# Patient Record
Sex: Male | Born: 1980 | Race: Black or African American | Hispanic: No | Marital: Single | Smoking: Current every day smoker
Health system: Southern US, Community
[De-identification: ages and names within clinical notes are randomized; demographics above are authoritative.]

## PROBLEM LIST (undated history)

## (undated) DIAGNOSIS — N289 Disorder of kidney and ureter, unspecified: Secondary | ICD-10-CM

## (undated) DIAGNOSIS — F329 Major depressive disorder, single episode, unspecified: Secondary | ICD-10-CM

## (undated) DIAGNOSIS — F32A Depression, unspecified: Secondary | ICD-10-CM

---
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
## 2002-05-29 ENCOUNTER — Emergency Department (HOSPITAL_COMMUNITY): Admission: EM | Admit: 2002-05-29 | Discharge: 2002-05-29 | Payer: Self-pay | Admitting: *Deleted

## 2002-08-16 ENCOUNTER — Encounter: Admission: RE | Admit: 2002-08-16 | Discharge: 2002-08-16 | Payer: Self-pay | Admitting: Family Medicine

## 2005-08-05 ENCOUNTER — Emergency Department (HOSPITAL_COMMUNITY): Admission: EM | Admit: 2005-08-05 | Discharge: 2005-08-05 | Payer: Self-pay | Admitting: Family Medicine

## 2005-11-29 ENCOUNTER — Emergency Department (HOSPITAL_COMMUNITY): Admission: EM | Admit: 2005-11-29 | Discharge: 2005-11-29 | Payer: Self-pay | Admitting: Emergency Medicine

## 2007-08-18 ENCOUNTER — Emergency Department (HOSPITAL_COMMUNITY): Admission: EM | Admit: 2007-08-18 | Discharge: 2007-08-18 | Payer: Self-pay | Admitting: Emergency Medicine

## 2009-05-11 ENCOUNTER — Emergency Department (HOSPITAL_COMMUNITY): Admission: EM | Admit: 2009-05-11 | Discharge: 2009-05-12 | Payer: Self-pay | Admitting: Emergency Medicine

## 2011-08-13 LAB — URINALYSIS, ROUTINE W REFLEX MICROSCOPIC
Hgb urine dipstick: NEGATIVE
Nitrite: NEGATIVE
Specific Gravity, Urine: 1.026
Urobilinogen, UA: 0.2

## 2011-08-13 LAB — COMPREHENSIVE METABOLIC PANEL
Albumin: 4.2
Alkaline Phosphatase: 57
Chloride: 106
Creatinine, Ser: 1.18
Glucose, Bld: 119 — ABNORMAL HIGH
Sodium: 140
Total Bilirubin: 1.4 — ABNORMAL HIGH

## 2011-08-13 LAB — CBC
Hemoglobin: 16.1
MCHC: 33.8
RDW: 13.4

## 2011-08-13 LAB — LIPASE, BLOOD: Lipase: 23

## 2011-08-13 LAB — DIFFERENTIAL
Eosinophils Absolute: 0.2
Lymphocytes Relative: 3 — ABNORMAL LOW
Monocytes Absolute: 0.4
Neutro Abs: 14.3 — ABNORMAL HIGH

## 2012-02-23 ENCOUNTER — Emergency Department (HOSPITAL_COMMUNITY): Payer: No Typology Code available for payment source

## 2012-02-23 ENCOUNTER — Emergency Department (INDEPENDENT_AMBULATORY_CARE_PROVIDER_SITE_OTHER)
Admission: EM | Admit: 2012-02-23 | Discharge: 2012-02-23 | Disposition: A | Discharge status: Nursing Home (Includes ICF) | Payer: Self-pay | Source: Home / Self Care | Attending: Emergency Medicine | Admitting: Emergency Medicine

## 2012-02-23 ENCOUNTER — Emergency Department (HOSPITAL_COMMUNITY)
Admission: EM | Admit: 2012-02-23 | Discharge: 2012-02-23 | Disposition: A | Discharge status: Home / Self Care | Payer: No Typology Code available for payment source | Attending: Emergency Medicine | Admitting: Emergency Medicine

## 2012-02-23 ENCOUNTER — Encounter (HOSPITAL_COMMUNITY): Payer: Self-pay | Admitting: *Deleted

## 2012-02-23 DIAGNOSIS — S069X9A Unspecified intracranial injury with loss of consciousness of unspecified duration, initial encounter: Secondary | ICD-10-CM

## 2012-02-23 DIAGNOSIS — S0990XA Unspecified injury of head, initial encounter: Secondary | ICD-10-CM | POA: Insufficient documentation

## 2012-02-23 DIAGNOSIS — T07XXXA Unspecified multiple injuries, initial encounter: Secondary | ICD-10-CM

## 2012-02-23 DIAGNOSIS — S060X9A Concussion with loss of consciousness of unspecified duration, initial encounter: Secondary | ICD-10-CM

## 2012-02-23 HISTORY — DX: Disorder of kidney and ureter, unspecified: N28.9

## 2012-02-23 MED ORDER — LIDOCAINE HCL 4 % EX SOLN
CUTANEOUS | Status: AC
  Administered 2012-02-23: 16:00:00
  Filled 2012-02-23: qty 50

## 2012-02-23 MED ORDER — TETANUS-DIPHTH-ACELL PERTUSSIS 5-2.5-18.5 LF-MCG/0.5 IM SUSP
0.5000 mL | Freq: Once | INTRAMUSCULAR | Status: AC
  Administered 2012-02-23: 0.5 mL via INTRAMUSCULAR

## 2012-02-23 MED ORDER — TETANUS-DIPHTH-ACELL PERTUSSIS 5-2.5-18.5 LF-MCG/0.5 IM SUSP
INTRAMUSCULAR | Status: AC
  Filled 2012-02-23: qty 0.5

## 2012-02-23 MED ORDER — LIDOCAINE HCL 4 % EX SOLN
CUTANEOUS | Status: AC
  Administered 2012-02-23: 15:00:00
  Filled 2012-02-23: qty 50
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
## 2012-02-23 NOTE — ED Notes (Signed)
Dr. Phillips at bedside.
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
## 2012-02-23 NOTE — ED Notes (Signed)
uknown  As  To  His  Last  Tet shot
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
## 2012-02-23 NOTE — ED Notes (Signed)
Dressings applied to abrasions.

## 2012-02-28 NOTE — ED Provider Notes (Signed)
I saw and evaluated the patient, reviewed the resident's note and I agree with the findings and plan.  Please see completed note for this encounter.  Raeford Razor, MD 02/28/12 [phone]
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
## 2013-07-17 ENCOUNTER — Emergency Department (HOSPITAL_COMMUNITY)
Admission: EM | Admit: 2013-07-17 | Discharge: 2013-07-17 | Disposition: A | Discharge status: Home / Self Care | Payer: BC Managed Care – PPO | Attending: Emergency Medicine | Admitting: Emergency Medicine

## 2013-07-17 ENCOUNTER — Encounter (HOSPITAL_COMMUNITY): Payer: Self-pay | Admitting: *Deleted

## 2013-07-17 DIAGNOSIS — Z79899 Other long term (current) drug therapy: Secondary | ICD-10-CM | POA: Insufficient documentation

## 2013-07-17 DIAGNOSIS — N289 Disorder of kidney and ureter, unspecified: Secondary | ICD-10-CM | POA: Insufficient documentation

## 2013-07-17 DIAGNOSIS — Y990 Civilian activity done for income or pay: Secondary | ICD-10-CM | POA: Insufficient documentation

## 2013-07-17 DIAGNOSIS — F17200 Nicotine dependence, unspecified, uncomplicated: Secondary | ICD-10-CM | POA: Insufficient documentation

## 2013-07-17 DIAGNOSIS — S46909A Unspecified injury of unspecified muscle, fascia and tendon at shoulder and upper arm level, unspecified arm, initial encounter: Secondary | ICD-10-CM | POA: Insufficient documentation

## 2013-07-17 DIAGNOSIS — Y9229 Other specified public building as the place of occurrence of the external cause: Secondary | ICD-10-CM | POA: Insufficient documentation

## 2013-07-17 DIAGNOSIS — S4980XA Other specified injuries of shoulder and upper arm, unspecified arm, initial encounter: Secondary | ICD-10-CM | POA: Insufficient documentation

## 2013-07-17 DIAGNOSIS — W2209XA Striking against other stationary object, initial encounter: Secondary | ICD-10-CM | POA: Insufficient documentation

## 2013-07-17 DIAGNOSIS — M25511 Pain in right shoulder: Secondary | ICD-10-CM

## 2013-07-17 MED ORDER — HYDROCODONE-ACETAMINOPHEN 5-325 MG PO TABS
1.0000 | ORAL_TABLET | Freq: Once | ORAL | Status: AC
  Administered 2013-07-17: 1 via ORAL
  Filled 2013-07-17: qty 1

## 2013-07-17 MED ORDER — IBUPROFEN 800 MG PO TABS
800.0000 mg | ORAL_TABLET | Freq: Once | ORAL | Status: AC
  Administered 2013-07-17: 800 mg via ORAL
  Filled 2013-07-17: qty 1

## 2013-07-17 MED ORDER — IBUPROFEN 800 MG PO TABS: 800.0000 mg | ORAL_TABLET | Freq: Three times a day (TID) | ORAL | Status: AC

## 2013-07-17 MED ORDER — CYCLOBENZAPRINE HCL 10 MG PO TABS: 10.0000 mg | ORAL_TABLET | Freq: Three times a day (TID) | ORAL | Status: AC | PRN

## 2013-07-17 MED ORDER — HYDROCODONE-ACETAMINOPHEN 5-325 MG PO TABS: 1.0000 | ORAL_TABLET | ORAL | Status: AC | PRN

## 2013-07-17 NOTE — ED Notes (Signed)
Pt reports hurting right shoulder last night while moving boxes. Pain 9/10. Limited range of motion. Denies numbness and tingling. Pain radiates from shoulder down to hand and from shoulder up to neck.

## 2013-07-17 NOTE — ED Provider Notes (Signed)
Medical screening examination/treatment/procedure(s) were performed by non-physician practitioner and as supervising physician I was immediately available for consultation/collaboration.  Leasha Goldberger F Adelaido Nicklaus, MD 07/17/13 1812 

## 2013-07-17 NOTE — ED Provider Notes (Signed)
CSN: [id number]     Arrival date & time 07/17/13  1028 History   First MD Initiated Contact with Patient 07/17/13 1119     Chief Complaint  Patient presents with  . Shoulder Pain   (Consider location/radiation/quality/duration/timing/severity/associated sxs/prior Treatment) Patient is a 32 y.o. male presenting with shoulder pain. The history is provided by the patient. No language interpreter was used.  Shoulder Pain This is a new problem. The current episode started yesterday. Pertinent negatives include no chest pain, chills, fever, neck pain, numbness or weakness. Associated symptoms comments: Right shoulder pain after hitting shoulder against equipment last night while working. No neck or chest pain. No previous shoulder problems. .    Past Medical History  Diagnosis Date  . Kidney disease    History reviewed. No pertinent past surgical history. History reviewed. No pertinent family history. History  Substance Use Topics  . Smoking status: Current Every Day Smoker  . Smokeless tobacco: Not on file  . Alcohol Use: No    Review of Systems  Constitutional: Negative for fever and chills.  HENT: Negative.  Negative for neck pain.   Cardiovascular: Negative.  Negative for chest pain.  Musculoskeletal:       See HPI  Skin: Negative.  Negative for color change and wound.  Neurological: Negative.  Negative for weakness and numbness.    Allergies  Review of patient's allergies indicates no known allergies.  Home Medications   Current Outpatient Rx  Name  Route  Sig  Dispense  Refill  . cyclobenzaprine (FLEXERIL) 10 MG tablet   Oral   Take 10 mg by mouth at bedtime as needed for muscle spasms.         . divalproex (DEPAKOTE ER) 500 MG 24 hr tablet   Oral   Take 500 mg by mouth every morning.         . Lurasidone HCl (LATUDA) 60 MG TABS   Oral   Take 1 tablet by mouth every evening.          BP 140/85  Pulse 75  Temp(Src) 99 F (37.2 C) (Oral)  Resp 18  SpO2  99% Physical Exam  Constitutional: He is oriented to person, place, and time. He appears well-developed and well-nourished.  Neck: Normal range of motion.  Pulmonary/Chest: Effort normal. He exhibits no tenderness.  Musculoskeletal: Normal range of motion.  Right shoulder without swelling, discoloration or bony deformity. Point tenderness over anterior deltoid area. No mass. No midline or paracervical neck tenderness. Range of motion of right shoulder limited on abduction and forward extension due to discomfort. 5/5 grip strength on right.   Neurological: He is alert and oriented to person, place, and time.  Skin: Skin is warm and dry.  Psychiatric: He has a normal mood and affect.    ED Course  Procedures (including critical care time) Labs Review Labs Reviewed - No data to display Imaging Review No results found.  MDM  No diagnosis found. 1. Right shoulder pain  No functional deficits on exam. Shoulder benign in appearance. Suspect mild soft tissue injury, ?bursitis.    Arnoldo Hooker, PA-C 07/17/13 1147

## 2015-09-05 ENCOUNTER — Emergency Department
Admission: EM | Admit: 2015-09-05 | Discharge: 2015-09-05 | Discharge status: Against Medical Advice | Payer: BLUE CROSS/BLUE SHIELD | Attending: Emergency Medicine | Admitting: Emergency Medicine

## 2015-09-05 DIAGNOSIS — R112 Nausea with vomiting, unspecified: Secondary | ICD-10-CM | POA: Diagnosis not present

## 2015-09-05 DIAGNOSIS — R197 Diarrhea, unspecified: Secondary | ICD-10-CM | POA: Diagnosis not present

## 2015-09-05 DIAGNOSIS — Z720 Tobacco use: Secondary | ICD-10-CM | POA: Insufficient documentation

## 2015-09-05 HISTORY — DX: Depression, unspecified: F32.A

## 2015-09-05 HISTORY — DX: Major depressive disorder, single episode, unspecified: F32.9

## 2015-09-05 LAB — COMPREHENSIVE METABOLIC PANEL
ALBUMIN: 4.3 g/dL (ref 3.5–5.0)
ALK PHOS: 66 U/L (ref 38–126)
ALT: 24 U/L (ref 17–63)
AST: 23 U/L (ref 15–41)
Anion gap: 4 — ABNORMAL LOW (ref 5–15)
BUN: 12 mg/dL (ref 6–20)
CALCIUM: 8.9 mg/dL (ref 8.9–10.3)
CO2: 25 mmol/L (ref 22–32)
CREATININE: 1.22 mg/dL (ref 0.61–1.24)
Chloride: 107 mmol/L (ref 101–111)
GFR calc Af Amer: >60 mL/min (ref >60)
GFR calc non Af Amer: >60 mL/min (ref >60)
GLUCOSE: 118 mg/dL — AB (ref 65–99)
Potassium: 4 mmol/L (ref 3.5–5.1)
SODIUM: 136 mmol/L (ref 135–145)
Total Bilirubin: 0.5 mg/dL (ref 0.3–1.2)
Total Protein: 7.7 g/dL (ref 6.5–8.1)

## 2015-09-05 LAB — CBC
HCT: 47.2 % (ref 40.0–52.0)
HEMOGLOBIN: 15.7 g/dL (ref 13.0–18.0)
MCH: 30.1 pg (ref 26.0–34.0)
MCHC: 33.2 g/dL (ref 32.0–36.0)
MCV: 90.8 fL (ref 80.0–100.0)
Platelets: 197 10*3/uL (ref 150–440)
RBC: 5.2 MIL/uL (ref 4.40–5.90)
RDW: 13.7 % (ref 11.5–14.5)
WBC: 10.7 10*3/uL — AB (ref 3.8–10.6)

## 2015-09-05 LAB — LIPASE, BLOOD: Lipase: 25 U/L (ref 11–51)

## 2015-09-05 NOTE — ED Notes (Signed)
caled in lobby. No answser.

## 2015-09-05 NOTE — ED Notes (Signed)
Patient with nausea, vomiting, and diarrhea for 2 days.  Patient reports vomiting 3 times yesterday and once today.  Patient reports having diarrhea every hour yesterday and today.

## 2015-09-06 ENCOUNTER — Emergency Department
Admission: EM | Admit: 2015-09-06 | Discharge: 2015-09-06 | Disposition: A | Discharge status: Home / Self Care | Payer: BLUE CROSS/BLUE SHIELD | Attending: Emergency Medicine | Admitting: Emergency Medicine

## 2015-09-06 DIAGNOSIS — K529 Noninfective gastroenteritis and colitis, unspecified: Secondary | ICD-10-CM | POA: Insufficient documentation

## 2015-09-06 DIAGNOSIS — Z720 Tobacco use: Secondary | ICD-10-CM | POA: Insufficient documentation

## 2015-09-06 DIAGNOSIS — R112 Nausea with vomiting, unspecified: Secondary | ICD-10-CM | POA: Diagnosis present

## 2015-09-06 DIAGNOSIS — Z79899 Other long term (current) drug therapy: Secondary | ICD-10-CM | POA: Insufficient documentation

## 2015-09-06 LAB — URINALYSIS COMPLETE WITH MICROSCOPIC (ARMC ONLY)
BACTERIA UA: NONE SEEN
Bilirubin Urine: NEGATIVE
Glucose, UA: NEGATIVE mg/dL
Hgb urine dipstick: NEGATIVE
Ketones, ur: NEGATIVE mg/dL
Leukocytes, UA: NEGATIVE
Nitrite: NEGATIVE
PH: 5 (ref 5.0–8.0)
PROTEIN: NEGATIVE mg/dL
Specific Gravity, Urine: 1.024 (ref 1.005–1.030)

## 2015-09-06 MED ORDER — DICYCLOMINE HCL 20 MG PO TABS: 20.0000 mg | ORAL_TABLET | Freq: Three times a day (TID) | ORAL | Status: AC | PRN

## 2015-09-06 MED ORDER — OMEPRAZOLE 40 MG PO CPDR: 40.0000 mg | DELAYED_RELEASE_CAPSULE | Freq: Every day | ORAL | Status: AC

## 2015-09-06 MED ORDER — GI COCKTAIL ~~LOC~~
30.0000 mL | Freq: Once | ORAL | Status: AC
  Administered 2015-09-06: 30 mL via ORAL
  Filled 2015-09-06: qty 30

## 2015-09-06 MED ORDER — DICYCLOMINE HCL 10 MG/ML IM SOLN: 20.0000 mg | Freq: Once | INTRAMUSCULAR | Status: DC

## 2015-09-06 MED ORDER — ONDANSETRON HCL 4 MG PO TABS: 4.0000 mg | ORAL_TABLET | Freq: Every day | ORAL | Status: AC | PRN

## 2015-09-06 MED ORDER — ONDANSETRON 4 MG PO TBDP
4.0000 mg | ORAL_TABLET | Freq: Once | ORAL | Status: AC
  Administered 2015-09-06: 4 mg via ORAL
  Filled 2015-09-06: qty 1

## 2015-09-06 NOTE — ED Provider Notes (Signed)
[facility] Emergency Department Provider Note   ____________________________________________  Time seen: 0730  I have reviewed the triage vital signs and the nursing notes.   HISTORY  Chief Complaint Emesis   History limited by: Not Limited   HPI Nicolas Williams is a 34 y.o. male who presents to the emergency department today because of concerns for abdominal pain, nausea vomiting and diarrhea. The patient states the symptoms started 3 days ago. He states that they have been worse at night. It will improve somewhat during the day. The patient states he has had multiple episodes of vomiting. He states the pain is burning. It is located periumbilically. His also had multiple episodes of diarrhea. States that he has not been around anybody hewing doses been sick. No uncooked or raw meats.  No travel. No fevers.    Past Medical History  Diagnosis Date  . Kidney disease   . Depression     There are no active problems to display for this patient.   No past surgical history on file.  Current Outpatient Rx  Name  Route  Sig  Dispense  Refill  . cyclobenzaprine (FLEXERIL) 10 MG tablet   Oral   Take 1 tablet (10 mg total) by mouth 3 (three) times daily as needed for muscle spasms.   21 tablet   0   . divalproex (DEPAKOTE ER) 500 MG 24 hr tablet   Oral   Take 500 mg by mouth every morning.         Marland Kitchen HYDROcodone-acetaminophen (NORCO/VICODIN) 5-325 MG per tablet   Oral   Take 1-2 tablets by mouth every 4 (four) hours as needed for pain.   12 tablet   0   . ibuprofen (ADVIL,MOTRIN) 800 MG tablet   Oral   Take 1 tablet (800 mg total) by mouth 3 (three) times daily.   21 tablet   0   . Lurasidone HCl (LATUDA) 60 MG TABS   Oral   Take 1 tablet by mouth every evening.           Allergies Review of patient's allergies indicates no known allergies.  No family history on file.  Social History Social History  Substance Use Topics  . Smoking  status: Current Every Day Smoker  . Smokeless tobacco: Not on file  . Alcohol Use: No    Review of Systems  Constitutional: Negative for fever. Cardiovascular: Negative for chest pain. Respiratory: Negative for shortness of breath. Gastrointestinal: Positive for abdominal pain, vomiting and diarrhea. Genitourinary: Negative for dysuria. Musculoskeletal: Negative for back pain. Skin: Negative for rash. Neurological: Negative for headaches, focal weakness or numbness.  10-point ROS otherwise negative.  ____________________________________________   PHYSICAL EXAM:  VITAL SIGNS: ED Triage Vitals  Enc Vitals Group     BP 09/06/15 0542 157/87 mmHg     Pulse Rate 09/06/15 0542 84     Resp 09/06/15 0542 18     Temp 09/06/15 0542 98.5 F (36.9 C)     Temp Source 09/06/15 0542 Oral     SpO2 09/06/15 0542 96 %     Weight 09/06/15 0542 300 lb (136.079 kg)     Height 09/06/15 0542  (2.007 m)     Head Cir --      Peak Flow --      Pain Score 09/06/15 0543 8   Constitutional: Alert and oriented. Well appearing and in no distress. Eyes: Conjunctivae are normal. PERRL. Normal extraocular movements. ENT  Head: Normocephalic and atraumatic.   Nose: No congestion/rhinnorhea.   Mouth/Throat: Mucous membranes are moist.   Neck: No stridor. Hematological/Lymphatic/Immunilogical: No cervical lymphadenopathy. Cardiovascular: Normal rate, regular rhythm.  No murmurs, rubs, or gallops. Respiratory: Normal respiratory effort without tachypnea nor retractions. Breath sounds are clear and equal bilaterally. No wheezes/rales/rhonchi. Gastrointestinal: Soft and moderately tender to palpation in the paraumbilical and epigastric region. Genitourinary: Deferred Musculoskeletal: Normal range of motion in all extremities. No joint effusions.  No lower extremity tenderness nor edema. Neurologic:  Normal speech and language. No gross focal neurologic deficits are appreciated.  Skin:   Skin is warm, dry and intact. No rash noted. Psychiatric: Mood and affect are normal. Speech and behavior are normal. Patient exhibits appropriate insight and judgment.  ____________________________________________    LABS (pertinent positives/negatives)  Labwork drawn 11/2 reviewed.  ____________________________________________   EKG  None  ____________________________________________    RADIOLOGY  None   ____________________________________________   PROCEDURES  Procedure(s) performed: None  Critical Care performed: No  ____________________________________________   INITIAL IMPRESSION / ASSESSMENT AND PLAN / ED COURSE  Pertinent labs & imaging results that were available during my care of the patient were reviewed by me and considered in my medical decision making (see chart for details).  Patient presents to the emergency department today with concerns for abdominal pain, vomiting and diarrhea. Physical exam without any findings. Patient stated he did feel better after GI cocktail. Think likely patient suffering from gastritis and gastritis. Discussed expected course of disease processes.  ____________________________________________   FINAL CLINICAL IMPRESSION(S) / ED DIAGNOSES  Final diagnoses:  Gastroenteritis     Phineas SemenGraydon Agatha Duplechain, MD 09/06/15 1228

## 2015-09-06 NOTE — ED Notes (Signed)
Pt in with co abd pain n.v.d x 3 days.

## 2015-09-06 NOTE — Discharge Instructions (Signed)
Please seek medical attention for any high fevers, chest pain, shortness of breath, change in behavior, persistent vomiting, bloody stool or any other new or concerning symptoms. ° ° °Abdominal Pain, Adult °Many things can cause abdominal pain. Usually, abdominal pain is not caused by a disease and will improve without treatment. It can often be observed and treated at home. Your health care provider will do a physical exam and possibly order blood tests and X-rays to help determine the seriousness of your pain. However, in many cases, more time must pass before a clear cause of the pain can be found. Before that point, your health care provider may not know if you need more testing or further treatment. °HOME CARE INSTRUCTIONS °Monitor your abdominal pain for any changes. The following actions may help to alleviate any discomfort you are experiencing: °· Only take over-the-counter or prescription medicines as directed by your health care provider. °· Do not take laxatives unless directed to do so by your health care provider. °· Try a clear liquid diet (broth, tea, or water) as directed by your health care provider. Slowly move to a bland diet as tolerated. °SEEK MEDICAL CARE IF: °· You have unexplained abdominal pain. °· You have abdominal pain associated with nausea or diarrhea. °· You have pain when you urinate or have a bowel movement. °· You experience abdominal pain that wakes you in the night. °· You have abdominal pain that is worsened or improved by eating food. °· You have abdominal pain that is worsened with eating fatty foods. °· You have a fever. °SEEK IMMEDIATE MEDICAL CARE IF: °· Your pain does not go away within 2 hours. °· You keep throwing up (vomiting). °· Your pain is felt only in portions of the abdomen, such as the right side or the left lower portion of the abdomen. °· You pass bloody or black tarry stools. °MAKE SURE YOU: °· Understand these instructions. °· Will watch your  condition. °· Will get help right away if you are not doing well or get worse. °  °This information is not intended to replace advice given to you by your health care provider. Make sure you discuss any questions you have with your health care provider. °  °Document Released: 07/30/2005 Document Revised: 07/11/2015 Document Reviewed: 06/29/2013 °[employer] Interactive Patient Education ©2016 [employer] ° °
